# Patient Record
Sex: Male | Born: 1985 | Race: Black or African American | Hispanic: No | Marital: Married | Smoking: Current every day smoker
Health system: Southern US, Community
[De-identification: ages and names within clinical notes are randomized; demographics above are authoritative.]

---
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
## 2008-06-18 ENCOUNTER — Emergency Department: Payer: Self-pay | Admitting: Emergency Medicine

## 2010-03-19 ENCOUNTER — Emergency Department: Payer: Self-pay | Admitting: Emergency Medicine

## 2010-05-30 IMAGING — CT CT STONE STUDY
1 of 2 series · 15 of 32 positions shown, 19 images · non-contrast
Comparison: none

REASON FOR EXAM: abdominal pain, hematuria, flank pain
COMMENTS:   LMP: male

PROCEDURE:     CT  - CT ABDOMEN /PELVIS WO (STONE)  - March 19, 2010 [DATE]
RESULT:     CT abdomen and pelvis dated 03/19/2010.
TECHNIQUE: Helical 3 mm sections were obtained from the lung bases through
the pubic symphysis without administration of intravenous contrast.

[Series 2: stone · axial · 0.66mm/px · z∈[-669,-264]mm · 15 of 152 slices shown, 19 images]
[im 11/152  soft-tissue]
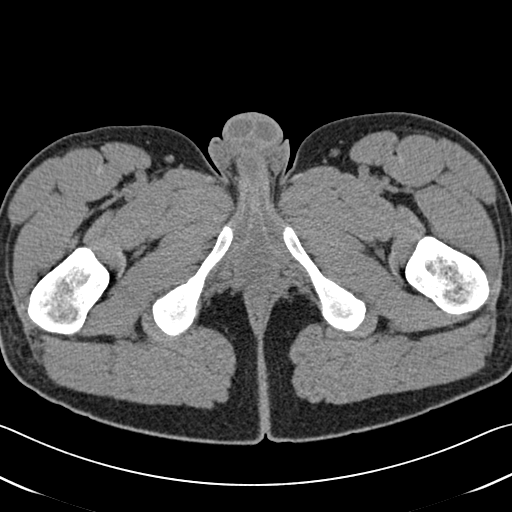
[im 11/152  bone]
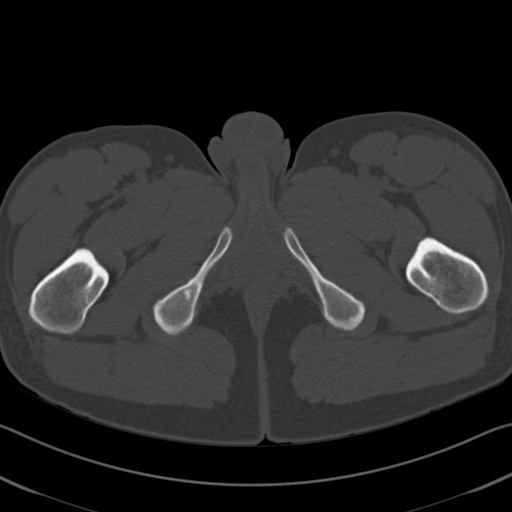
[im 22/152  soft-tissue]
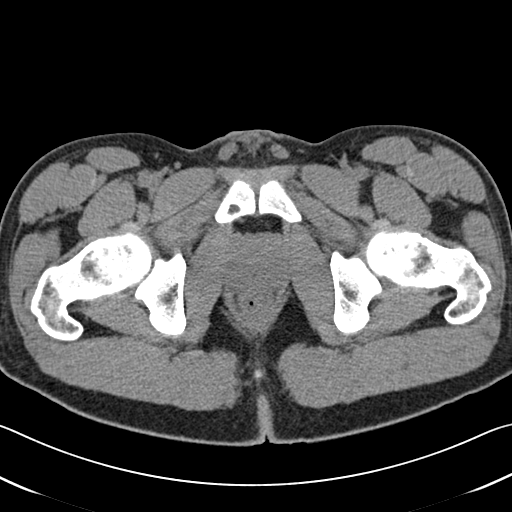
[im 33/152  soft-tissue]
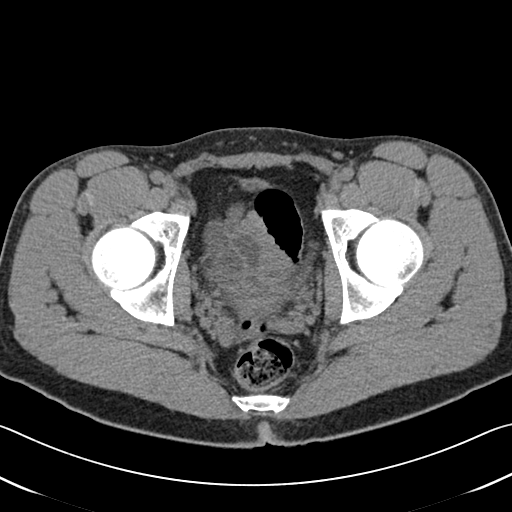
[im 44/152  soft-tissue]
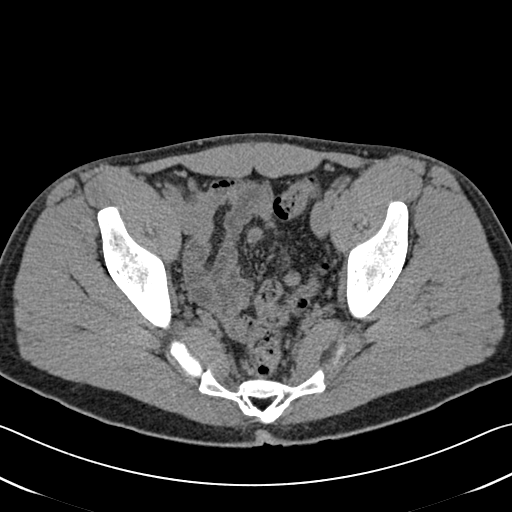
[im 54/152  soft-tissue]
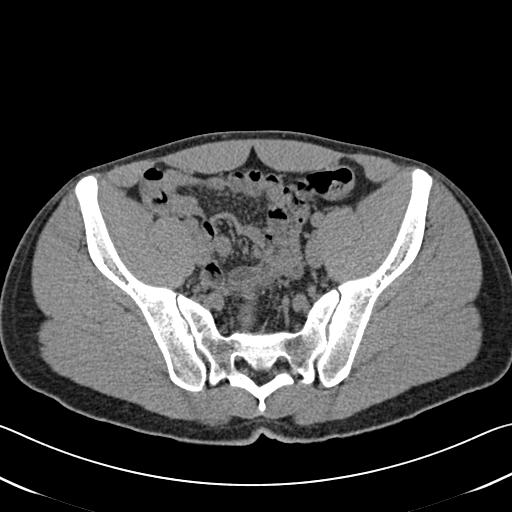
[im 65/152  soft-tissue]
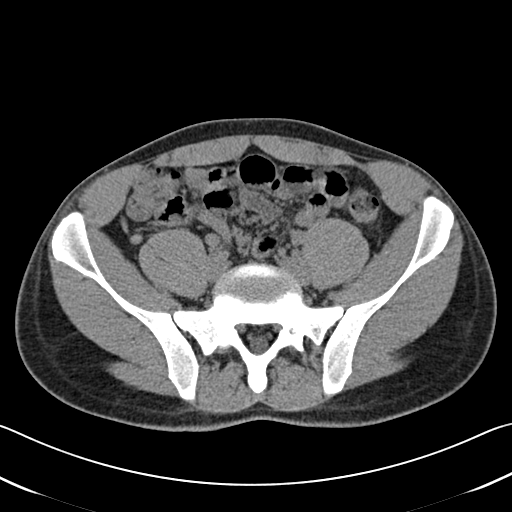
[im 76/152  soft-tissue]
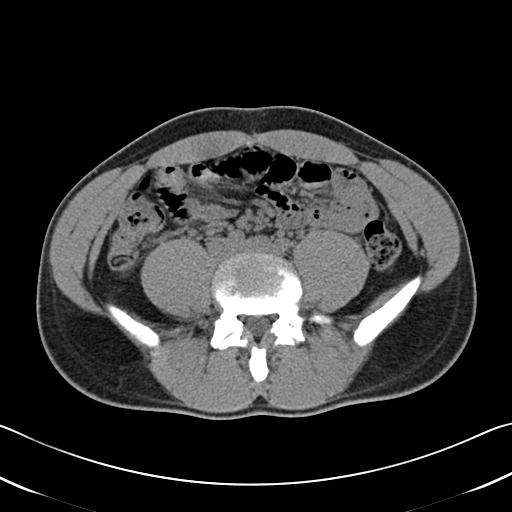
[im 87/152  soft-tissue]
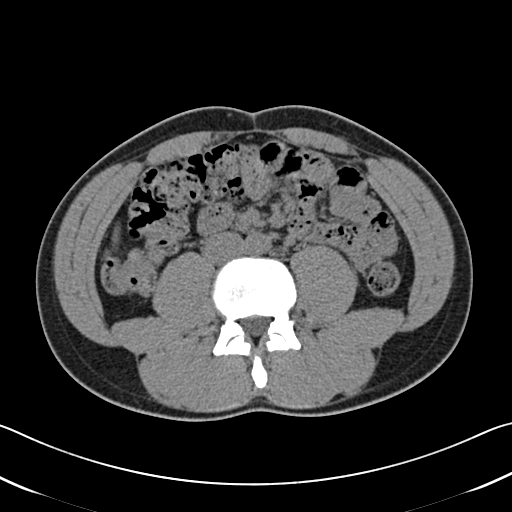
[im 98/152  soft-tissue]
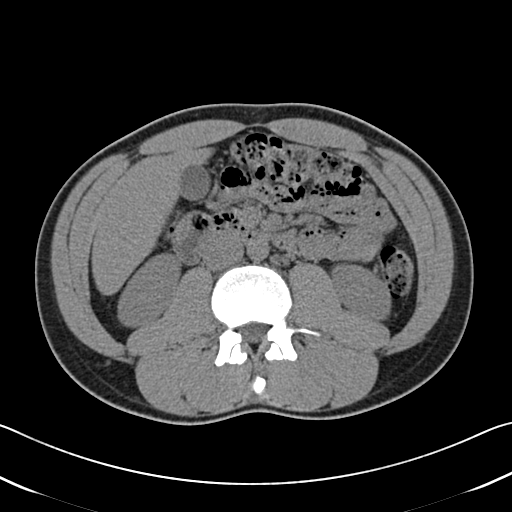
[im 98/152  bone]
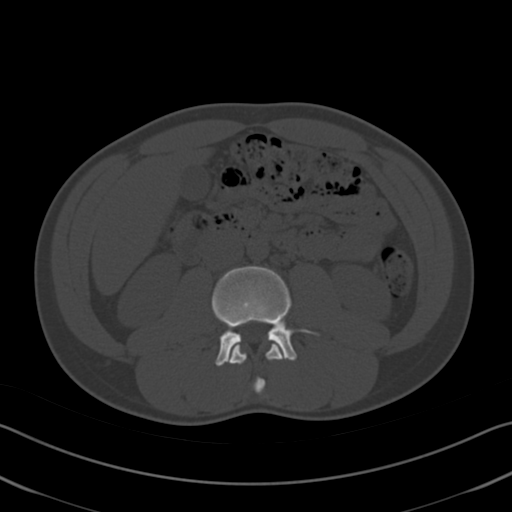
[im 108/152  soft-tissue]
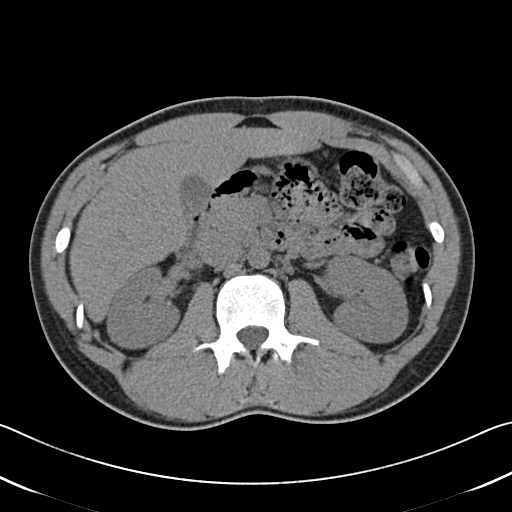
[im 119/152  soft-tissue]
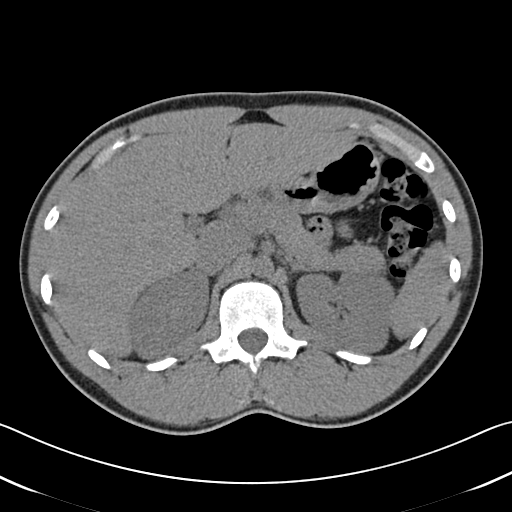
[im 130/152  soft-tissue]
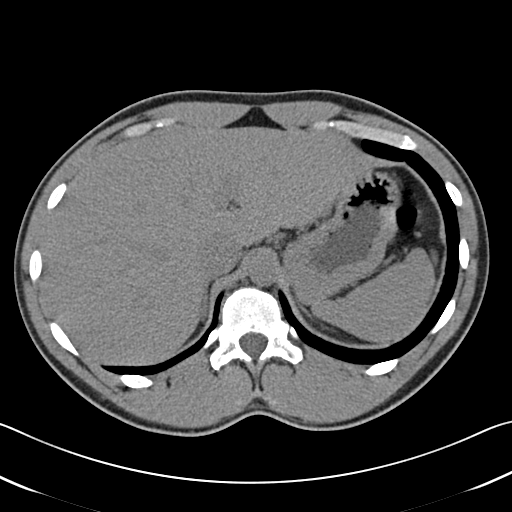
[im 130/152  lung]
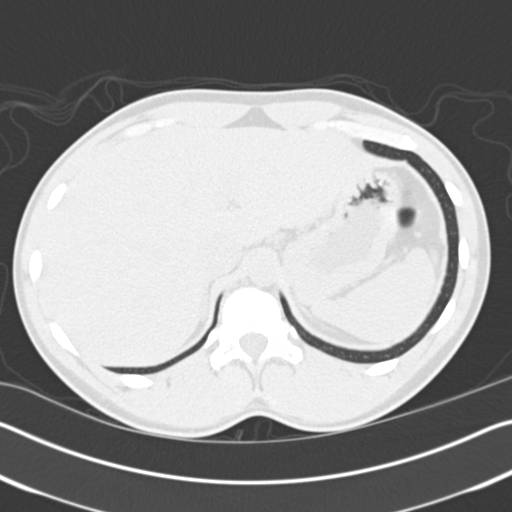
[im 135/152  lung]
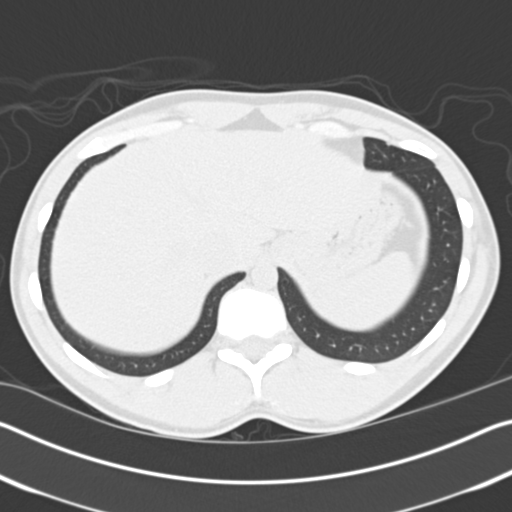
[im 141/152  soft-tissue]
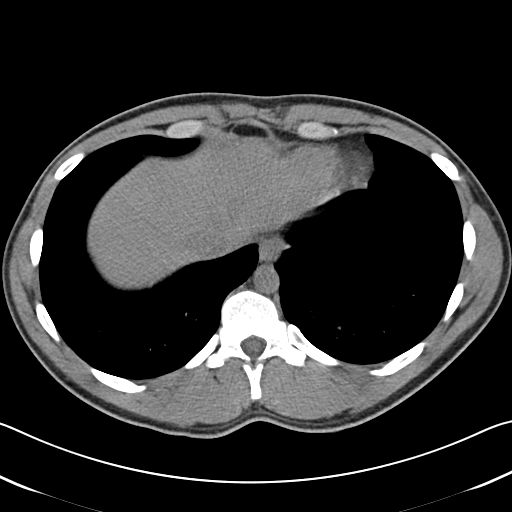
[im 141/152  lung]
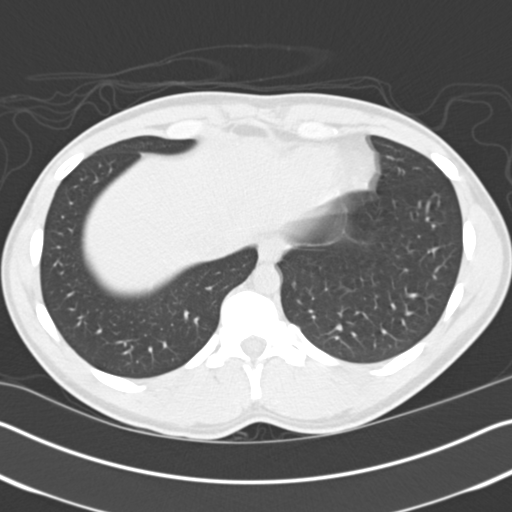
[im 146/152  lung]
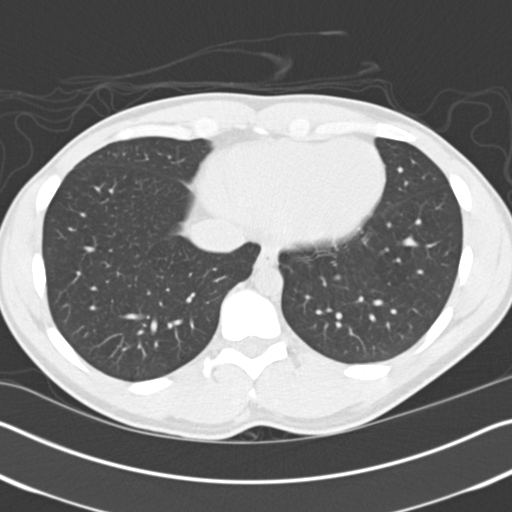

[15 of 32 positions shown; findings below may reference images not displayed]

FINDINGS: Evaluation of the lung bases is unremarkable.

Noncontrast evaluation of the liver, spleen, adrenals, pancreas, is
unremarkable. Evaluation of the kidneys demonstrates no evidence of
hydronephrosis, hydroureter, nor ureteral or nephrolithiasis. There is no CT
evidence of a bowel obstruction. Within the limitations of a noncontrasted
CT there is no evidence of enteritis, colitis, diverticulitis, nor
appendicitis. There is no CT evidence of abdominal aortic aneurysm.
IMPRESSION: No CT evidence of obstructive or inflammatory abnormalities
within the limitations of a noncontrasted CT. Specifically there is no CT
evidence of renal calculus disease.

## 2011-01-04 ENCOUNTER — Inpatient Hospital Stay: Payer: Self-pay | Admitting: Psychiatry

## 2012-03-29 ENCOUNTER — Emergency Department: Payer: Self-pay | Admitting: *Deleted

## 2012-11-30 ENCOUNTER — Emergency Department: Payer: Self-pay | Admitting: Emergency Medicine

## 2012-11-30 LAB — CBC
HGB: 15.3 g/dL (ref 13.0–18.0)
MCH: 33 pg (ref 26.0–34.0)
MCHC: 35.1 g/dL (ref 32.0–36.0)

## 2012-11-30 LAB — COMPREHENSIVE METABOLIC PANEL
Anion Gap: 8 (ref 7–16)
BUN: 13 mg/dL (ref 7–18)
Bilirubin,Total: 0.6 mg/dL (ref 0.2–1.0)
Calcium, Total: 9.5 mg/dL (ref 8.5–10.1)
Glucose: 78 mg/dL (ref 65–99)
Osmolality: 273 (ref 275–301)
SGOT(AST): 40 U/L — ABNORMAL HIGH (ref 15–37)

## 2012-12-01 LAB — URINALYSIS, COMPLETE
Blood: NEGATIVE
Glucose,UR: NEGATIVE mg/dL (ref 0–75)
Nitrite: NEGATIVE
Ph: 6 (ref 4.5–8.0)
Protein: NEGATIVE
RBC,UR: <1 /HPF (ref 0–5)
Specific Gravity: 1.027 (ref 1.003–1.030)
Squamous Epithelial: 2

## 2012-12-20 ENCOUNTER — Emergency Department: Payer: Self-pay | Admitting: Emergency Medicine

## 2012-12-20 LAB — COMPREHENSIVE METABOLIC PANEL
Albumin: 4 g/dL (ref 3.4–5.0)
Alkaline Phosphatase: 94 U/L (ref 50–136)
Anion Gap: 9 (ref 7–16)
BUN: 10 mg/dL (ref 7–18)
Bilirubin,Total: 0.3 mg/dL (ref 0.2–1.0)
Co2: 25 mmol/L (ref 21–32)
EGFR (Non-African Amer.): >60
Glucose: 84 mg/dL (ref 65–99)
Osmolality: 278 (ref 275–301)
SGOT(AST): 27 U/L (ref 15–37)
Sodium: 140 mmol/L (ref 136–145)

## 2012-12-20 LAB — CBC
HCT: 41.4 % (ref 40.0–52.0)
HGB: 14.1 g/dL (ref 13.0–18.0)
MCH: 32.4 pg (ref 26.0–34.0)
MCHC: 34.1 g/dL (ref 32.0–36.0)

## 2012-12-22 LAB — URINALYSIS, COMPLETE
Bacteria: NONE SEEN
Bilirubin,UR: NEGATIVE
Ketone: NEGATIVE
Ph: 5 (ref 4.5–8.0)
Protein: NEGATIVE
RBC,UR: <1 /HPF (ref 0–5)

## 2013-03-02 IMAGING — CT CT ABD-PELV W/ CM
1 of 2 series · 15 of 32 positions shown, 19 images · IV contrast (isovue)
Comparison: None

REASON FOR EXAM: (1) abd pain - lower - eval for appendicitis; (2) abd
pain - lower - eval for ap
COMMENTS:

PROCEDURE:     CT  - CT ABDOMEN / PELVIS  W  - December 20, 2012  [DATE]
RESULT:     History: Abdominal pain
TECHNIQUE: Multiple axial images of the abdomen and pelvis were performed
from the lung bases to the pubic symphysis, with p.o. contrast and with 100
ml of Isovue 300 intravenous contrast.

[Series 2: 3mm soft tissue · axial · 0.68mm/px · z∈[-426,-24]mm · 15 of 148 slices shown, 19 images]
[im 7/148  soft-tissue]
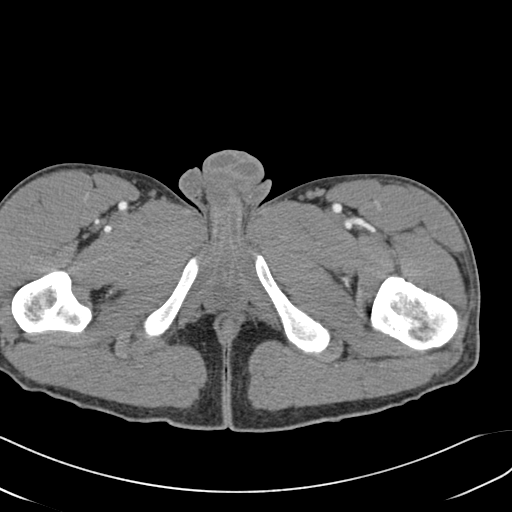
[im 7/148  bone]
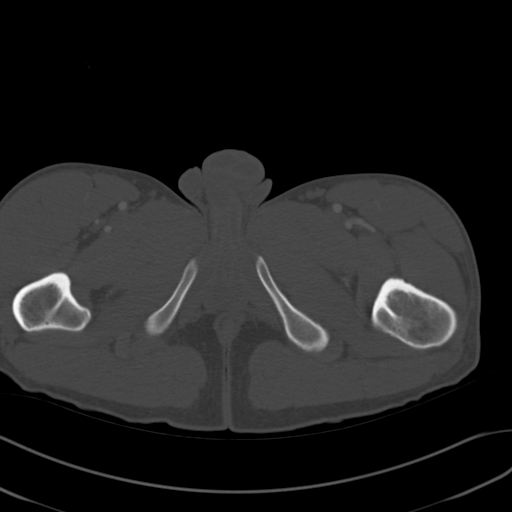
[im 19/148  soft-tissue]
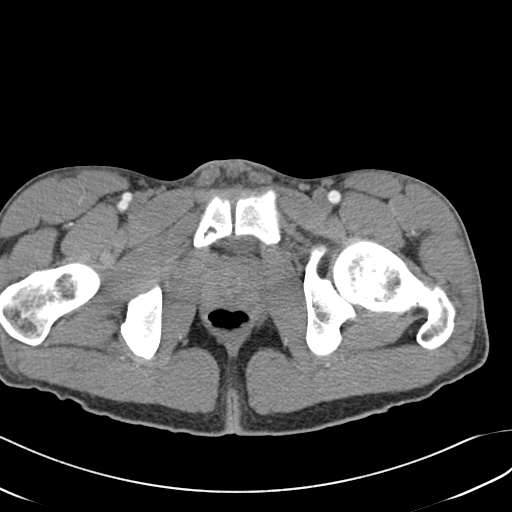
[im 31/148  soft-tissue]
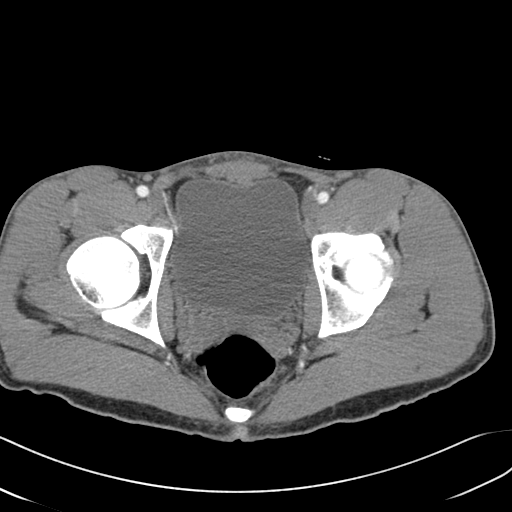
[im 43/148  soft-tissue]
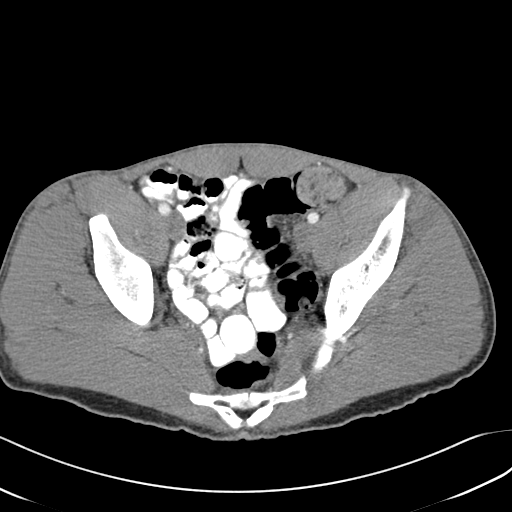
[im 50/148  soft-tissue]
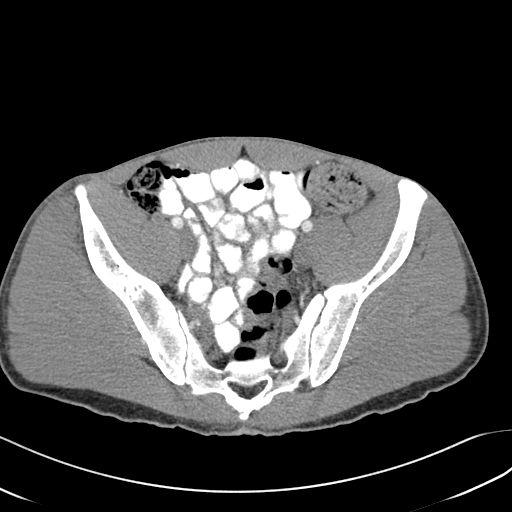
[im 62/148  soft-tissue]
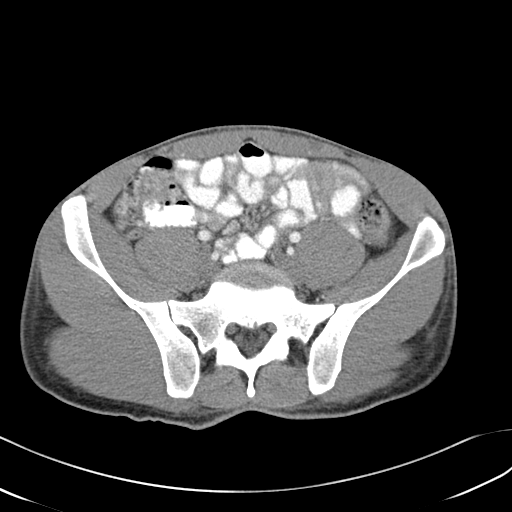
[im 74/148  soft-tissue]
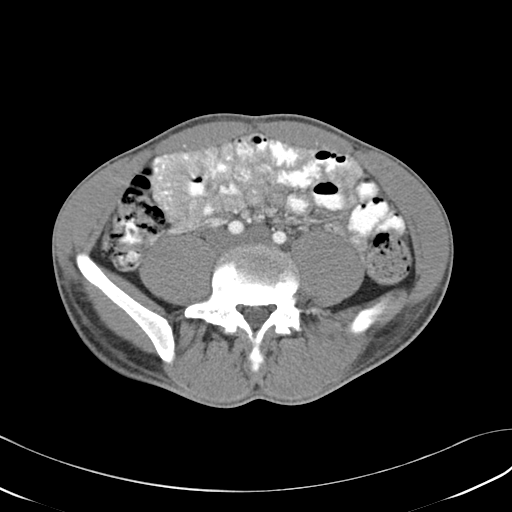
[im 86/148  soft-tissue]
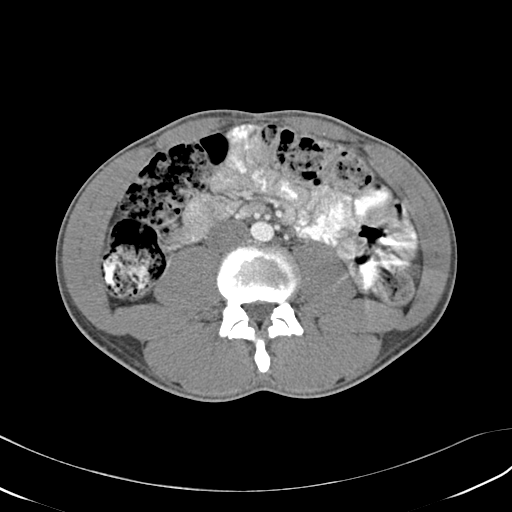
[im 99/148  soft-tissue]
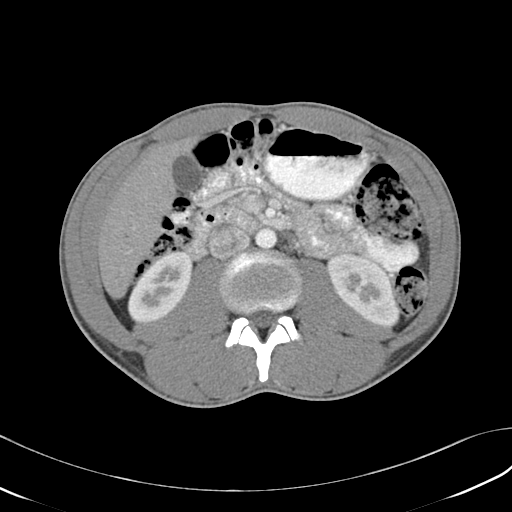
[im 99/148  bone]
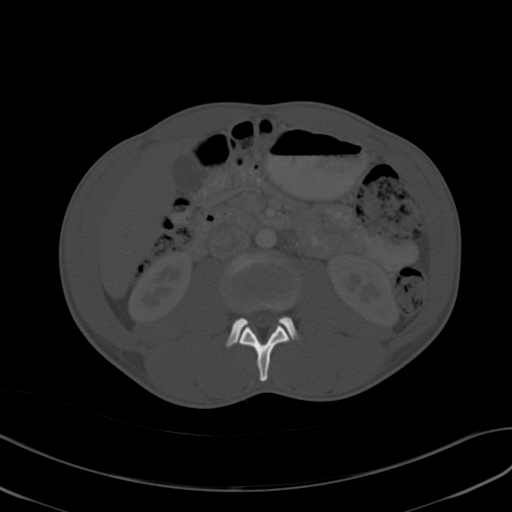
[im 105/148  soft-tissue]
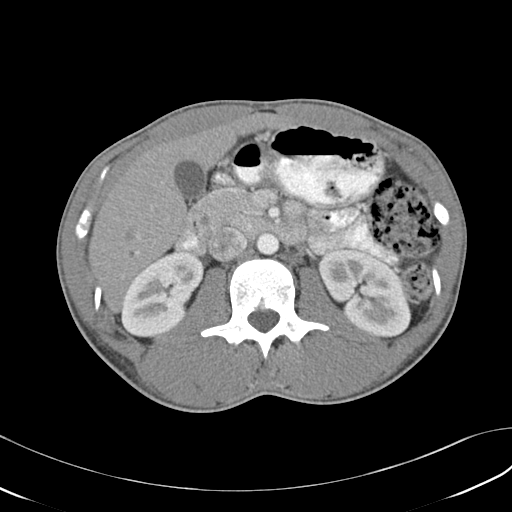
[im 117/148  soft-tissue]
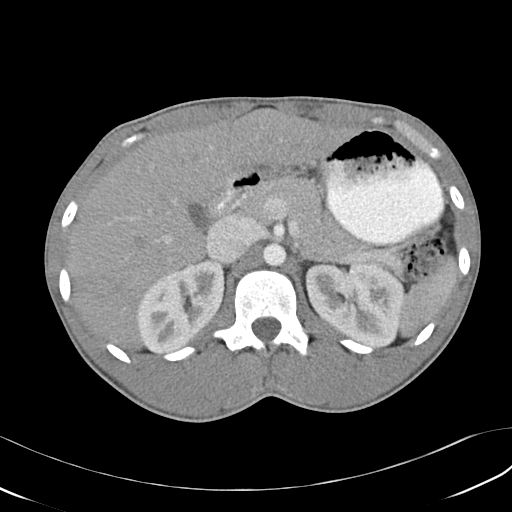
[im 123/148  lung]
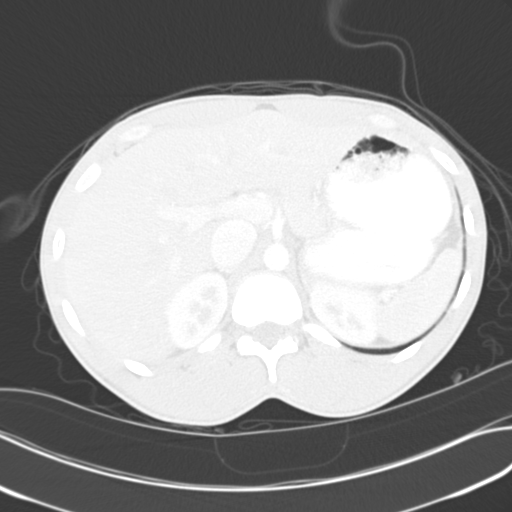
[im 129/148  soft-tissue]
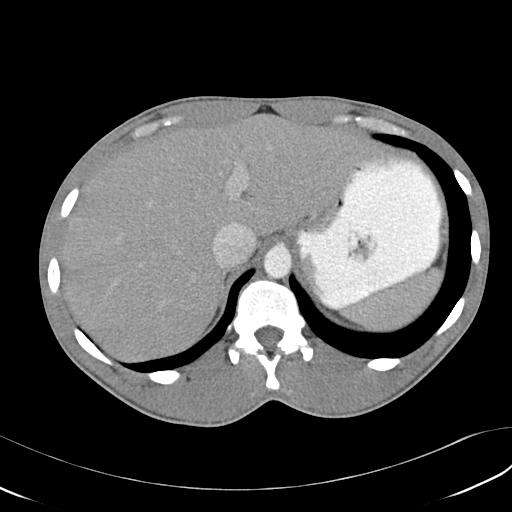
[im 129/148  lung]
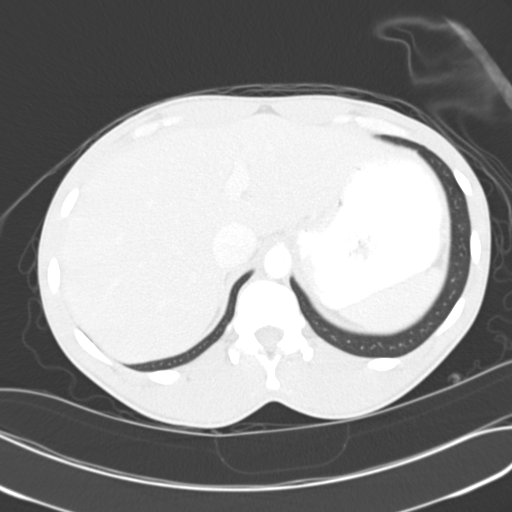
[im 135/148  lung]
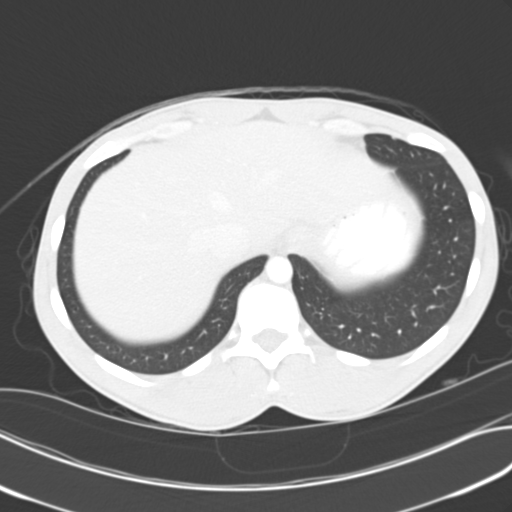
[im 141/148  soft-tissue]
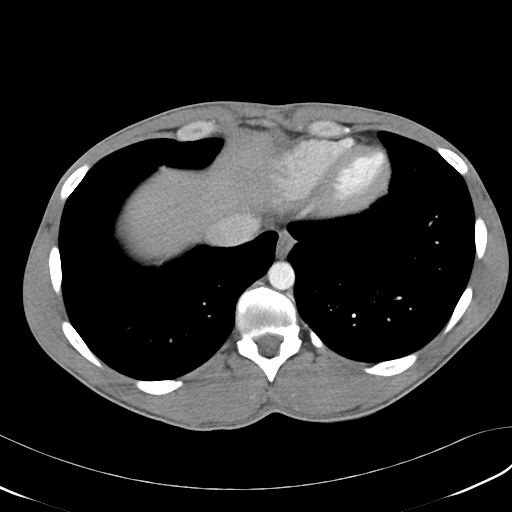
[im 141/148  lung]
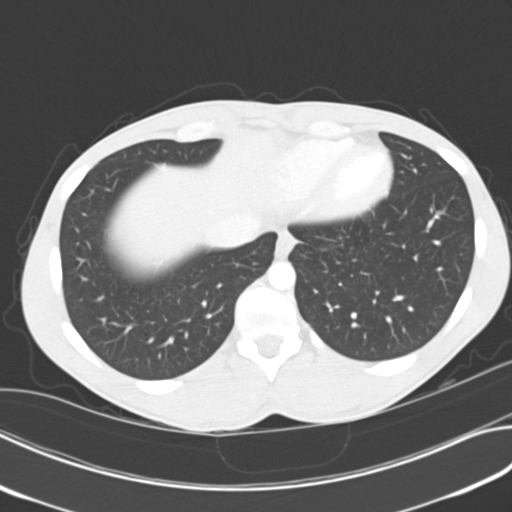

[15 of 32 positions shown; findings below may reference images not displayed]

FINDINGS: The lung bases are clear. There is no pneumothorax. The heart size is
normal.

The liver demonstrates no focal abnormality. There is no intrahepatic or
extrahepatic biliary ductal dilatation. The gallbladder is unremarkable. The
spleen demonstrates no focal abnormality. The kidneys, adrenal glands, and
pancreas are normal. The bladder is unremarkable.

The stomach, duodenum, small intestine, and large intestine demonstrate no
contrast extravasation or dilatation. There is a normal caliber appendix in
the right lower quadrant without periappendiceal inflammatory changes. There
is no pneumoperitoneum, pneumatosis, or portal venous gas. There is no
abdominal or pelvic free fluid. There is no lymphadenopathy.

The abdominal aorta is normal in caliber .

There is a lucent lesion in the right inferior pubic ramus unchanged the
prior exam likely representing a benign fibro-osseous lesion.
IMPRESSION: 1. Normal appendix.

[REDACTED]

## 2013-04-30 ENCOUNTER — Emergency Department: Payer: Self-pay | Admitting: Internal Medicine

## 2013-04-30 LAB — CBC
HCT: 44.5 % (ref 40.0–52.0)
HGB: 15.6 g/dL (ref 13.0–18.0)
MCH: 32.3 pg (ref 26.0–34.0)
RBC: 4.84 10*6/uL (ref 4.40–5.90)
RDW: 13 % (ref 11.5–14.5)

## 2013-04-30 LAB — URINALYSIS, COMPLETE
Bacteria: NONE SEEN
Bilirubin,UR: NEGATIVE
Glucose,UR: NEGATIVE mg/dL (ref 0–75)
Leukocyte Esterase: NEGATIVE
RBC,UR: <1 /HPF (ref 0–5)
Specific Gravity: 1.027 (ref 1.003–1.030)
WBC UR: <1 /HPF (ref 0–5)

## 2013-04-30 LAB — COMPREHENSIVE METABOLIC PANEL
Alkaline Phosphatase: 100 U/L (ref 50–136)
Anion Gap: 7 (ref 7–16)
BUN: 15 mg/dL (ref 7–18)
Bilirubin,Total: 0.4 mg/dL (ref 0.2–1.0)
Chloride: 107 mmol/L (ref 98–107)
Creatinine: 1.13 mg/dL (ref 0.60–1.30)
EGFR (Non-African Amer.): >60
Osmolality: 279 (ref 275–301)
Potassium: 3.5 mmol/L (ref 3.5–5.1)
SGPT (ALT): 23 U/L (ref 12–78)
Total Protein: 8.5 g/dL — ABNORMAL HIGH (ref 6.4–8.2)

## 2013-04-30 LAB — DRUG SCREEN, URINE
Barbiturates, Ur Screen: NEGATIVE (ref ?–200)
Benzodiazepine, Ur Scrn: NEGATIVE (ref ?–200)
Cannabinoid 50 Ng, Ur ~~LOC~~: NEGATIVE (ref ?–50)
Cocaine Metabolite,Ur ~~LOC~~: NEGATIVE (ref ?–300)
Methadone, Ur Screen: NEGATIVE (ref ?–300)
Tricyclic, Ur Screen: NEGATIVE (ref ?–1000)

## 2013-04-30 LAB — ETHANOL
Ethanol %: <0.003 % (ref 0.000–0.080)
Ethanol: <3 mg/dL

## 2013-04-30 LAB — TSH: Thyroid Stimulating Horm: 0.69 u[IU]/mL

## 2013-08-06 ENCOUNTER — Emergency Department (HOSPITAL_COMMUNITY)
Admission: EM | Admit: 2013-08-06 | Discharge: 2013-08-06 | Disposition: A | Discharge status: Home / Self Care | Payer: Self-pay | Attending: Emergency Medicine | Admitting: Emergency Medicine

## 2013-08-06 ENCOUNTER — Encounter (HOSPITAL_COMMUNITY): Payer: Self-pay | Admitting: *Deleted

## 2013-08-06 DIAGNOSIS — F17200 Nicotine dependence, unspecified, uncomplicated: Secondary | ICD-10-CM | POA: Insufficient documentation

## 2013-08-06 DIAGNOSIS — K05219 Aggressive periodontitis, localized, unspecified severity: Secondary | ICD-10-CM | POA: Insufficient documentation

## 2013-08-06 MED ORDER — HYDROCODONE-ACETAMINOPHEN 5-325 MG PO TABS: 1.0000 | ORAL_TABLET | Freq: Four times a day (QID) | ORAL | Status: DC | PRN

## 2013-08-06 MED ORDER — HYDROCODONE-ACETAMINOPHEN 5-325 MG PO TABS
2.0000 | ORAL_TABLET | Freq: Once | ORAL | Status: AC
  Administered 2013-08-06: 2 via ORAL
  Filled 2013-08-06: qty 2

## 2013-08-06 MED ORDER — IBUPROFEN 400 MG PO TABS
600.0000 mg | ORAL_TABLET | Freq: Once | ORAL | Status: AC
  Administered 2013-08-06: 10:00:00 600 mg via ORAL
  Filled 2013-08-06 (×2): qty 1

## 2013-08-06 MED ORDER — IBUPROFEN 600 MG PO TABS: 600.0000 mg | ORAL_TABLET | Freq: Four times a day (QID) | ORAL | Status: DC | PRN

## 2013-08-06 MED ORDER — METRONIDAZOLE 500 MG PO TABS: 500.0000 mg | ORAL_TABLET | Freq: Two times a day (BID) | ORAL | Status: DC

## 2013-08-06 MED ORDER — PENICILLIN V POTASSIUM 500 MG PO TABS: 500.0000 mg | ORAL_TABLET | Freq: Four times a day (QID) | ORAL | Status: AC

## 2013-08-06 NOTE — ED Provider Notes (Signed)
CSN: [id number]     Arrival date & time 08/06/13  7846 History   First MD Initiated Contact with Patient 08/06/13 [phone]     Chief Complaint  Patient presents with  . Dental Pain   (Consider location/radiation/quality/duration/timing/severity/associated sxs/prior Treatment) HPI Comments: Pt comes in with cc of toothache. Pt has had a toothache off and on for several days now, more constant x 2 days. The pain is in the Left lower quadrant, and is throbbing, worse with po intake and with cold temps. No n/v/f/c/trismus. + swelling to the left jaw. Pt is noted to have a low grade temp in the ED.  Patient is a 27 y.o. male presenting with tooth pain. The history is provided by the patient.  Dental Pain Associated symptoms: no drooling and no neck pain     History reviewed. No pertinent past medical history. History reviewed. No pertinent past surgical history. History reviewed. No pertinent family history. History  Substance Use Topics  . Smoking status: Current Every Day Smoker  . Smokeless tobacco: Not on file  . Alcohol Use: Yes    Review of Systems  Constitutional: Negative for activity change and appetite change.  HENT: Positive for dental problem. Negative for sore throat, drooling, neck pain and neck stiffness.   Respiratory: Negative for cough and shortness of breath.   Cardiovascular: Negative for chest pain.  Gastrointestinal: Negative for abdominal pain.  Genitourinary: Negative for dysuria.    Allergies  Review of patient's allergies indicates no known allergies.  Home Medications   Current Outpatient Rx  Name  Route  Sig  Dispense  Refill  . Acetaminophen (TYLENOL PO)   Oral   Take 2 tablets by mouth every 6 (six) hours as needed (pain).          BP 143/81  Pulse 80  Temp(Src) 100.3 F (37.9 C) (Oral)  Resp 20  SpO2 99% Physical Exam  Nursing note and vitals reviewed. Constitutional: He is oriented to person, place, and time. He appears well-developed.   HENT:  Head: Normocephalic and atraumatic.  Pt has dental erosion - tooth 20. There is no gingival fluctuance, no bleeding or drainage. Pt has tenderness to palpation. No cervical lymphadenopathy, no trismus or drooling.  Eyes: Conjunctivae and EOM are normal. Pupils are equal, round, and reactive to light.  Neck: Normal range of motion. Neck supple.  Cardiovascular: Normal rate and regular rhythm.   Pulmonary/Chest: Effort normal and breath sounds normal.  Abdominal: Soft. Bowel sounds are normal. He exhibits no distension. There is no tenderness. There is no rebound and no guarding.  Lymphadenopathy:    He has no cervical adenopathy.  Neurological: He is alert and oriented to person, place, and time.  Skin: Skin is warm.    ED Course  Procedures (including critical care time) Labs Review Labs Reviewed - No data to display Imaging Review No results found.  MDM  No diagnosis found.  DDx includes: - Periapical tooth infection - Dental abscess - Gingivitis - Dental trauma - Pulpitis - Nerve root compression  Pt comes in with cc of toothache, noted to have a fever. No systemic signs concerning for deep infection - no drooling, trismus, lymphadenopathy. Does have some left mandibular swelling - likely reactive, doubt abscess. Pt is not immunocompromised.  Pt has no insurance. Will d.c with Pen VK and also flagyl - to get even better anaerobic coverage incase this is an evolving infection. Return precautions discussed. Pt understands that he needs to see a  dentist immediately.    Derwood Kaplan, MD 08/06/13 [phone]

## 2013-08-06 NOTE — ED Notes (Signed)
Patient presents to ed c/o left lower toothache onset 3 days ago. States this am lower jaw is swollen

## 2014-06-02 ENCOUNTER — Emergency Department (HOSPITAL_COMMUNITY)
Admission: EM | Admit: 2014-06-02 | Discharge: 2014-06-02 | Disposition: A | Discharge status: Home / Self Care | Payer: Self-pay | Attending: Emergency Medicine | Admitting: Emergency Medicine

## 2014-06-02 ENCOUNTER — Encounter (HOSPITAL_COMMUNITY): Payer: Self-pay | Admitting: Emergency Medicine

## 2014-06-02 DIAGNOSIS — F17200 Nicotine dependence, unspecified, uncomplicated: Secondary | ICD-10-CM | POA: Insufficient documentation

## 2014-06-02 DIAGNOSIS — K029 Dental caries, unspecified: Secondary | ICD-10-CM | POA: Insufficient documentation

## 2014-06-02 DIAGNOSIS — G8929 Other chronic pain: Secondary | ICD-10-CM | POA: Insufficient documentation

## 2014-06-02 DIAGNOSIS — R6884 Jaw pain: Secondary | ICD-10-CM | POA: Insufficient documentation

## 2014-06-02 DIAGNOSIS — Z792 Long term (current) use of antibiotics: Secondary | ICD-10-CM | POA: Insufficient documentation
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
## 2014-06-02 NOTE — Discharge Instructions (Signed)
1. Medications: usual home medications 2. Treatment: rest, drink plenty of fluids,  3. Follow Up: Please followup with Dr. Erik Obey for discussion of your diagnoses and further evaluation after today's visit; if you do not have a primary care doctor use the resource guide provided to find one;     Emergency Department Resource Guide 1) Find a Doctor and Pay Out of Pocket Although you won't have to find out who is covered by your insurance plan, it is a good idea to ask around and get recommendations. You will then need to call the office and see if the doctor you have chosen will accept you as a new patient and what types of options they offer for patients who are self-pay. Some doctors offer discounts or will set up payment plans for their patients who do not have insurance, but you will need to ask so you aren't surprised when you get to your appointment.  2) Contact Your Local Health Department Not all health departments have doctors that can see patients for sick visits, but many do, so it is worth a call to see if yours does. If you don't know where your local health department is, you can check in your phone book. The CDC also has a tool to help you locate your state's health department, and many state websites also have listings of all of their local health departments.  3) Find a [facility] If your illness is not likely to be very severe or complicated, you may want to try a walk in clinic. These are popping up all over the country in pharmacies, drugstores, and shopping centers. They're usually staffed by nurse practitioners or physician assistants that have been trained to treat common illnesses and complaints. They're usually fairly quick and inexpensive. However, if you have serious medical issues or chronic medical problems, these are probably not your best option.  No Primary Care Doctor: - Call Health Connect at  [phone] - they can help you locate a primary care doctor that   accepts your insurance, provides certain services, etc. - Physician Referral Service- [phone]  Chronic Pain Problems: Organization         Address  Phone   Notes  [facility]  [phone] Patients need to be referred by their primary care doctor.   Medication Assistance: Organization         Address  Phone   Notes  [facility] Medication [facility] [address] [place], [place] [address] [phone] --Must be a resident of [facility] -- Must have NO insurance coverage whatsoever (no Medicaid/ Medicare, etc.) -- The pt. MUST have a primary care doctor that directs their care regularly and follows them in the community   [facility]  [phone]   [employer]  [phone]    Agencies that provide inexpensive medical care: Organization         Address  Phone   Notes  [place]  [phone]   [facility]    [phone]   [facility] [place], [place] [address] [phone]   [facility] [address], [place] [phone]   [facility]anned Parenthood    [phone]   [facility]    [phone]   [address], [place] Phone:  [phone], Fax:  [phone] Hours of Operation:  9 am - 6 pm, M-F.  Also accepts Medicaid/Medicare and self-pay.  °[facility] ° [address], [place] Phone: ([phone]) [phone], Fax: [phone]. Hours of Operation:  8:30 am - 5:30 pm, M-F.  Also accepts Medicaid and self-pay.  °[facility] [place] [address], [place] Phone: (336) [phone]   °[facility] [address], [place], [place] (336)723-1848, Ext. 123 Mondays & Thursdays: 7-9 AM.  First 15 patients are seen on a first come, first serve basis. °  ° °Medicaid-accepting [place] Providers: ° °Organization          Address  Phone   Notes  °[facility] [address], [place] ([phone]) [phone] Also accepts self-pay patients.  °[facility] [address], [place] ° (336) [phone]   °[facility] [address], [place] (336) 288-8857   °[facility] [address] [place] [address], [place] (336) 299-7000   °Veita Bland [address], [place]  ° (336) [phone] Only accepts [place] Access Medicaid patients after they have their name applied to their card.  ° °Self-Pay (no insurance) in [place]: ° °Organization         Address  Phone   Notes  °Sickle Cell Patients, [facility] [address], [place] (336) 832-1970   °[facility] [address], [place] (336) 832-4400   °[facility] ° [address], [place] (336) [phone]   °[facility]/Dr. Osei-Bonsu ° [address] [place] [address], [place] or [address], [place] (336) [phone] Phone number for both [place] and [place] locations is the same.  °[facility] [address], [place] (336) [phone]   °[facility] [address] [place] [address], [place] or [address] (336) 852-7530 °(336) 878-2260   °[facility] [address], [place] (336) 350-1642, phone; (336) [phone], fax Sees patients 1st and 3rd Saturday of every month.  Must not qualify for public or private insurance (i.e. Medicaid, Medicare, [place] Health Choice, Veterans' Benefits) • Household income should be no more than 200% of the poverty level •The clinic cannot treat you if you are pregnant or think you are pregnant • Sexually transmitted diseases are not treated at the clinic.  ° ° °Dental Care: °Organization         Address  Phone  Notes  °[facility] [facility] [address],  [place] (336) 641-6152 Accepts children up to age 21 who are enrolled in Medicaid or [place] Health Choice; pregnant women with a Medicaid card; and children who have applied for Medicaid or [place] Health Choice, but were declined, whose parents can pay a reduced fee at time of service.  °[place] [place]  [address], [place] (336) 641-7733 Accepts children up to age 21 who are enrolled in Medicaid or [place] Health Choice; pregnant women with a Medicaid card; and children who have applied for Medicaid or [place] Health Choice, but were declined, whose parents can pay a reduced fee at time of service.  °[facility] ° [address],  (336) 641-4533 Patients are seen by appointment only. Walk-ins are not accepted. [facility] will see patients 18 years   of age and older. °Monday - Tuesday (8am-5pm) °Most Wednesdays (8:30-5pm) °$30 per visit, cash only  °[facility] ° [address], [place] (336) [phone] Patients are seen by appointment only. Walk-ins are not accepted. [facility] will see patients 18 years of age and older. °One Wednesday Evening (Monthly: Volunteer Based).  $30 per visit, cash only  °[facility]  ([phone]) [phone] for adults; Children under age 4, call Graduate Pediatric Dentistry at ([phone]) [phone]. Children aged 4-14, please call ([phone]) [phone] to request a pediatric application. ° Dental services are provided in all areas of dental care including fillings, crowns and bridges, complete and partial dentures, implants, gum treatment, root canals, and extractions. Preventive care is also provided. Treatment is provided to both adults and children. °Patients are selected via a lottery and there is often a waiting list. °  °[facility] [address], °[place] ° (336) 763-8833 [email or web address] °  °[facility] [address], [place], [place]  (336)723-1848, Ext. 123 Second and Fourth Thursday of each month, opens at 6:30 AM; Clinic ends at 9 AM.  Patients are seen on a first-come first-served basis, and a limited number are seen during each clinic.  ° °[facility] ° [address], [place], [place] (336) [phone]   Eligibility Requirements °You must have lived in [place], [place], or [place] counties for at least the last three months. °  You cannot be eligible for state or federal sponsored healthcare insurance, including Veterans Administration, Medicaid, or Medicare. °  You generally cannot be eligible for healthcare insurance through your employer.  °  How to apply: °Eligibility screenings are held every Tuesday and Wednesday afternoon from 1:00 pm until 4:00 pm. You do not need an appointment for the interview!  °[facility] [address], [place], [place] 336-631-2330   °[place] [facility]  336-342-8273   °[facility]  336-703-3100   °[facility]  336-570-6415   ° °Behavioral Health Resources in the Community: °Intensive Outpatient Programs °Organization         Address  Phone  Notes  °[facility] [address], [place], [place] 336-878-6098   °[facility] [address], [place], [place] 336-832-9800   °[facility] [address], [place], [place] ° 336-882-2125   °[facility] [address],  °[place], [place] 1-800-853-5163 or 336-641-4981   °Substance Abuse Resources °Organization         Address  Phone  Notes  °[facility]  336-882-2125   °[facility]  336-784-9470   °[facility]  336-285-9073   °[facility]  336-845-3988   °Residential & Outpatient Substance Abuse Program  1-800-659-3381   °Psychological Services °Organization         Address  Phone  Notes  °[facility]  336- 832-9600   °[facility]  336- 378-7881    °[facility] [address], [place] 1-800-853-5163 or 336-641-4981   ° °Mobile Crisis Teams °Organization         Address  Phone  Notes  °[facility], Mobile Crisis Care Unit  1-877-626-1772   °Assertive °[facility] ° [address] [place], [place] 336-834-9664   °Sharon DeEsch [address] °[place]  336-554-5454   ° °Self-Help/Support Groups °Organization           Address  Phone             Notes  °[facility] [place] - variety of support groups  336- 373-1402 Call for more information  °Narcotics Anonymous (NA), [facility] [address], °[place]  2 meetings at this location  ° °Residential Treatment Programs °Organization         Address  Phone  Notes  °[facility] [address],    °[place]  1-866-801-8205   °[facility] ° [address], [place], [place] 704-293-8524   °[facility] [address], [place] 336-845-3988 Admissions: 8am-3pm M-F  °[facility] [address]    °[place], [place] 336-841-1104   °[facility] [address], [place], [place] 336-379-7146   °[facility] [address]  °[place], [place] 336-285-9073   °[facility] Intensive Outpatient [address], [place], [place] 336-852-3033   °[facility] [address]  °[place], [place] 1-877-615-2722 or 336-784-9470   °[facility] [address] [place], [place] 336-227-7417 Accepts Medicaid  °[facility] [address]  °[place] 1-800-659-3381 Substance Abuse/Addiction Treatment  ° °[place] Behavioral Health Resources °Organization         Address  Phone  Notes  °[facility]  (888) 581-9988   °Julie Brannon, PhD [address] [place], [place]   (336) 349-5553 or (336) 951-0000   °[facility]   [address] °[place], [place] (336) 349-4454   °[facility] [address], [place], [place] (336) 342-8316 Insurance/Medicaid/sponsorship through [facility]  °[facility] [address]                                    [place], [place] (336) 342-8316 Therapy/tele-psych/case  °[facility] [address]  ° [place], [place] (336) 349-2233    °Dr. Arfeen  (336) 349-4544   °[facility] [place]  [employer] [place] [facility] 1) [address], [place] °2) [address], [place] °3)  [address], [place] (336) 349-3220 °(336) 342-7768 ° °(336) 342-8140   °[place] Child Abuse [facility] (336) 342-1394 or (336) 342-3537 (After Hours)    ° ° ° ° °

## 2014-06-03 NOTE — ED Provider Notes (Signed)
Medical screening examination/treatment/procedure(s) were performed by non-physician practitioner and as supervising physician I was immediately available for consultation/collaboration.   EKG Interpretation None       Kalman Drape, MD 06/03/14 [phone]

## 2014-07-30 ENCOUNTER — Emergency Department (HOSPITAL_COMMUNITY)
Admission: EM | Admit: 2014-07-30 | Discharge: 2014-07-30 | Disposition: A | Discharge status: Home / Self Care | Payer: Self-pay | Attending: Emergency Medicine | Admitting: Emergency Medicine

## 2014-07-30 ENCOUNTER — Encounter (HOSPITAL_COMMUNITY): Payer: Self-pay | Admitting: Emergency Medicine

## 2014-07-30 DIAGNOSIS — F17200 Nicotine dependence, unspecified, uncomplicated: Secondary | ICD-10-CM | POA: Insufficient documentation

## 2014-07-30 DIAGNOSIS — G479 Sleep disorder, unspecified: Secondary | ICD-10-CM | POA: Insufficient documentation

## 2014-07-30 NOTE — ED Provider Notes (Signed)
Medical screening examination/treatment/procedure(s) were performed by non-physician practitioner and as supervising physician I was immediately available for consultation/collaboration.   EKG Interpretation None       Richarda Blade, MD 07/30/14 2151
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
## 2014-07-30 NOTE — ED Notes (Signed)
Pt reports that he has been moaning in his sleep at night and his girlfriend feels like he is having sexual dreams about someone else. Pt states that as a result that he does not want to go to sleep at night. Denies any problem falling asleep. Reports that he does not want to sleep because of the dreams he is having. No other complaints at this time.
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
## 2014-07-30 NOTE — Discharge Instructions (Signed)
°Emergency Department Resource Guide °1) Find a Doctor and Pay Out of Pocket °Although you won't have to find out who is covered by your insurance plan, it is a good idea to ask around and get recommendations. You will then need to call the office and see if the doctor you have chosen will accept you as a new patient and what types of options they offer for patients who are self-pay. Some doctors offer discounts or will set up payment plans for their patients who do not have insurance, but you will need to ask so you aren't surprised when you get to your appointment. ° °2) Contact Your Local Health Department °Not all health departments have doctors that can see patients for sick visits, but many do, so it is worth a call to see if yours does. If you don't know where your local health department is, you can check in your phone book. The CDC also has a tool to help you locate your state's health department, and many state websites also have listings of all of their local health departments. ° °3) Find a Walk-in Clinic °If your illness is not likely to be very severe or complicated, you may want to try a walk in clinic. These are popping up all over the country in pharmacies, drugstores, and shopping centers. They're usually staffed by nurse practitioners or physician assistants that have been trained to treat common illnesses and complaints. They're usually fairly quick and inexpensive. However, if you have serious medical issues or chronic medical problems, these are probably not your best option. ° °No Primary Care Doctor: °- Call [facility] at  832-8000 - they can help you locate a primary care doctor that  accepts your insurance, provides certain services, etc. °- Physician Referral Service- 1-800-533-3463 ° °Chronic Pain Problems: °Organization         Address  Phone   Notes  °[facility]  (336) 297-2271 Patients need to be referred by their primary care doctor.  ° °Medication  Assistance: °Organization         Address  Phone   Notes  °[place] [facility] [address] °[place], [place] [address] (336) 641-8030 --Must be a resident of [place] °-- Must have NO insurance coverage whatsoever (no Medicaid/ Medicare, etc.) °-- The pt. MUST have a primary care doctor that directs their care regularly and follows them in the community °  °[facility]  (866) 331-1348   °[employer]  (888) 892-1162   ° °Agencies that provide inexpensive medical care: °Organization         Address  Phone   Notes  °[facility]  (336) 832-8035   ° Internal Medicine    (336) 832-7272   °[facility] [address] °[place], [place] [address] (336) 832-4777   °[facility] [address], °[place] (336) 271-4999   °[facility]    (336) 373-0678   °[facility]    (336) 272-1050   °[facility] ° [address], [place] Phone:  (336) 832-4444, Fax:  (336) 832-4440 Hours of Operation:  9 am - 6 pm, M-F.  Also accepts Medicaid/Medicare and self-pay.  °[facility] ° [address],  Phone: (336) 832-3150, Fax: (336) 832-3151. Hours of Operation:  8:30 am - 5:30 pm, M-F.  Also accepts Medicaid and self-pay.  °[facility] [place] [address], [place] Phone: (336) 878-6027   °[facility] [address], [place], [place] (336)723-1848, Ext. 123 Mondays & Thursdays: 7-9 AM.  First 15 patients are seen on a first come, first serve basis. °  ° °Medicaid-accepting [place] Providers: ° °Organization         Address  Phone   Notes  °[facility] [address], [place] (336) 641-2100 Also accepts self-pay patients.  °[facility] [address], [place] ° (336) 856-9996   °[facility] [address], [place]  (336) 288-8857   °[facility] [address] [place] [address], [place] (336) 299-7000   °Veita Bland [address], [place]  ° (336) 373-1557 Only accepts [place] Access Medicaid patients after they have their name applied to their card.  ° °Self-Pay (no insurance) in [place]: ° °Organization         Address  Phone   Notes  °Sickle Cell Patients, [facility] [address], [place] (336) 832-1970   °[facility] [address], [place] (336) 832-4400   °[facility] [place] ° [address], [place] (336) 992-4800   °[facility]/Dr. Osei-Bonsu ° [address] [place] [address], [place] or [address], [place] (336) 841-8500 Phone number for both [place] and [place] locations is the same.  °[facility] [address], [place] (336) 299-0000   °[facility] [address] [place] [address], [place] or [address] (336) 852-7530 °(336) 878-2260   °[facility] [address], [place] (336) 350-1642, phone; (336) 294-5005, fax Sees patients 1st and 3rd Saturday of every month.  Must not qualify for public or private insurance (i.e. Medicaid, Medicare, [place] Health Choice, Veterans' Benefits) • Household income should be no more than 200% of the poverty level •The clinic cannot treat you if you are pregnant or think you are pregnant • Sexually transmitted diseases are not treated at the clinic.  ° ° °Dental Care: °Organization         Address  Phone  Notes  °[place] [facility] [address],  (336) 641-6152 Accepts children up to age 21 who are enrolled in Medicaid or [place] Health Choice; pregnant women with a Medicaid card; and children who have applied for Medicaid or [place] Health Choice, but were declined, whose parents can pay a reduced fee at time of service.  °[place]  [facility] [place]  [address], [place] (336) 641-7733 Accepts children up to age 21 who are enrolled in Medicaid or [place] Health Choice; pregnant women with a Medicaid card; and children who have applied for Medicaid or [place] Health Choice, but were declined, whose parents can pay a reduced fee at time of service.  °[facility] ° [address],  (336) 641-4533 Patients are seen by appointment only. Walk-ins are not accepted. [facility] will see patients 18 years of age and older. °Monday - Tuesday (8am-5pm) °Most Wednesdays (8:30-5pm) °$30 per visit, cash only  °[facility] ° [address], [place] (336) 641-4533 Patients are seen by appointment only. Walk-ins are not accepted. [facility] will see patients 18 years of age and older. °One   Wednesday Evening (Monthly: Volunteer Based).  $30 per visit, cash only  °[facility]  (919) 537-3737 for adults; Children under age 4, call Graduate Pediatric Dentistry at (919) [phone]. Children aged 4-14, please call (919) [phone] to request a pediatric application. ° Dental services are provided in all areas of dental care including fillings, crowns and bridges, complete and partial dentures, implants, gum treatment, root canals, and extractions. Preventive care is also provided. Treatment is provided to both adults and children. °Patients are selected via a lottery and there is often a waiting list. °  °[facility] [address], °[place] ° (336) 763-8833 [email or web address] °  °[facility] [address], [place], [place] (336)723-1848, Ext. 123 Second and Fourth Thursday of each month, opens at 6:30 AM; Clinic ends at 9 AM.  Patients are seen on a first-come first-served basis, and a limited number are seen during each clinic.  ° °[facility] ° [address], [place], [place] (336) 723-7904    Eligibility Requirements °You must have lived in [place], [place], or [place] counties for at least the last three months. °  You cannot be eligible for state or federal sponsored healthcare insurance, including Veterans Administration, Medicaid, or Medicare. °  You generally cannot be eligible for healthcare insurance through your employer.  °  How to apply: °Eligibility screenings are held every Tuesday and Wednesday afternoon from 1:00 pm until 4:00 pm. You do not need an appointment for the interview!  °[facility] [address], [place], [place] 336-631-2330   °[facility] [facility]  336-342-8273   °[facility]  336-703-3100   °[facility]  336-570-6415   ° °Behavioral Health Resources in the Community: °Intensive Outpatient Programs °Organization         Address  Phone  Notes  °[facility] [address], [place], [place] 336-878-6098   °[facility] [address], [place], [place] 336-832-9800   °[facility] [address], [place], [place] ° 336-882-2125   °[facility] [address],  °[place], [place] 1-800-853-5163 or 336-641-4981   °Substance Abuse Resources °Organization         Address  Phone  Notes  °[facility]  336-882-2125   °[facility]  336-784-9470   °[facility]  336-285-9073   °[facility]  336-845-3988   °Residential & Outpatient Substance Abuse Program  1-800-659-3381   °Psychological Services °Organization         Address  Phone  Notes  °[facility]  336- 832-9600   °[facility]  336- 378-7881   °[facility] [address], [place] 1-800-853-5163 or 336-641-4981   ° °Mobile Crisis Teams °Organization         Address  Phone  Notes  °[facility], Mobile Crisis Care Unit  1-877-626-1772   °Assertive °[facility] ° [address]  [place], [place] 336-834-9664   °Sharon DeEsch [address] °[place] 336-554-5454   ° °Self-Help/Support Groups °Organization         Address  Phone             Notes  °[facility]  - variety of support groups  336- 373-1402 Call for more information  °Narcotics Anonymous (NA), [facility] [address], °[place]  2 meetings at this location  ° °  Residential Treatment Programs °Organization         Address  Phone  Notes  °[facility] [address],    °[facility]  1-866-801-8205   °[facility] ° [address], [place], [place] 704-293-8524   °[facility] [address], [place] 336-845-3988 Admissions: 8am-3pm M-F  °[facility] [address]    °[place], [place] 336-841-1104   °[facility] [address], [place], [place] 336-379-7146   °[facility] [address]  °[place], [place] 336-285-9073   °[facility] Intensive Outpatient [address], [place], [place] 336-852-3033   °[facility] [address]  °[place], [place] 1-877-615-2722 or 336-784-9470   °[facility] [address] [place], [place] 336-227-7417 Accepts Medicaid  °[facility] [address]  °[place] 1-800-659-3381 Substance Abuse/Addiction Treatment  ° °[place] Behavioral Health Resources °Organization         Address  Phone  Notes  °[facility]  (888) 581-9988   °Julie Brannon, PhD [address] [place], [place]   (336) 349-5553 or (336) 951-0000   °[facility]   [address] °[place], [place] (336) 349-4454   °[facility] [address], [place], [place] (336) 342-8316 Insurance/Medicaid/sponsorship through [facility]  °[facility] [address]                                    [place], [place] (336) 342-8316 Therapy/tele-psych/case    °[facility] [address]  ° [place], [place] (336) 349-2233    °Dr. Arfeen  (336) 349-4544   °[facility] [place]  [employer] [place] [facility] 1) [address], [place] °2) [address], [place] °3)  [address], [place] (336) 349-3220 °(336) 342-7768 ° °(336) 342-8140   °[place] Child [facility] (336) 342-1394 or (336) 342-3537 (After Hours)    ° ° °

## 2018-07-02 ENCOUNTER — Emergency Department (HOSPITAL_COMMUNITY): Payer: Self-pay

## 2018-07-02 ENCOUNTER — Encounter (HOSPITAL_COMMUNITY): Payer: Self-pay | Admitting: Emergency Medicine

## 2018-07-02 ENCOUNTER — Emergency Department (HOSPITAL_COMMUNITY)
Admission: EM | Admit: 2018-07-02 | Discharge: 2018-07-02 | Discharge status: Against Medical Advice | Payer: Self-pay | Attending: Emergency Medicine | Admitting: Emergency Medicine

## 2018-07-02 DIAGNOSIS — Z5321 Procedure and treatment not carried out due to patient leaving prior to being seen by health care provider: Secondary | ICD-10-CM | POA: Insufficient documentation

## 2018-07-02 DIAGNOSIS — M25511 Pain in right shoulder: Secondary | ICD-10-CM | POA: Insufficient documentation

## 2018-07-02 NOTE — ED Triage Notes (Signed)
Patient reported that he felt his right shoulder joint slipped out of place this morning , denies fall or injury , pt. stated it happened in the past .

## 2018-07-02 NOTE — ED Notes (Signed)
Pt called for room with no response x3. Pt moved OTF

## 2018-07-02 NOTE — ED Notes (Signed)
No response x2  for room

## 2018-07-02 NOTE — ED Notes (Signed)
No reply at 21:55 for vitals recheck.

## 2018-07-06 NOTE — ED Notes (Signed)
Follow up call made  No answer  07/06/18  1412  s Rad Gramling rn

## 2019-11-15 ENCOUNTER — Ambulatory Visit: Payer: Self-pay | Attending: Internal Medicine

## 2019-11-15 DIAGNOSIS — Z20822 Contact with and (suspected) exposure to covid-19: Secondary | ICD-10-CM

## 2019-11-15 DIAGNOSIS — Z20828 Contact with and (suspected) exposure to other viral communicable diseases: Secondary | ICD-10-CM | POA: Insufficient documentation

## 2019-11-16 LAB — NOVEL CORONAVIRUS, NAA: SARS-CoV-2, NAA: NOT DETECTED

## 2019-11-17 ENCOUNTER — Telehealth: Payer: Self-pay | Admitting: General Practice

## 2019-11-17 NOTE — Telephone Encounter (Signed)
Gave patient negative covid test results Patient understood 

## 2021-10-31 ENCOUNTER — Ambulatory Visit (HOSPITAL_BASED_OUTPATIENT_CLINIC_OR_DEPARTMENT_OTHER): Payer: Self-pay | Admitting: Family Medicine

## 2021-11-07 ENCOUNTER — Encounter (HOSPITAL_BASED_OUTPATIENT_CLINIC_OR_DEPARTMENT_OTHER): Payer: Self-pay | Admitting: Family Medicine

## 2021-11-27 DIAGNOSIS — H5213 Myopia, bilateral: Secondary | ICD-10-CM | POA: Diagnosis not present

## 2021-11-27 DIAGNOSIS — H5203 Hypermetropia, bilateral: Secondary | ICD-10-CM | POA: Diagnosis not present

## 2021-12-17 ENCOUNTER — Ambulatory Visit (HOSPITAL_BASED_OUTPATIENT_CLINIC_OR_DEPARTMENT_OTHER): Payer: Self-pay | Admitting: Family Medicine

## 2021-12-20 ENCOUNTER — Encounter (HOSPITAL_BASED_OUTPATIENT_CLINIC_OR_DEPARTMENT_OTHER): Payer: Self-pay | Admitting: Family Medicine

## 2022-02-07 ENCOUNTER — Ambulatory Visit (HOSPITAL_BASED_OUTPATIENT_CLINIC_OR_DEPARTMENT_OTHER): Payer: Medicaid Other | Admitting: Family Medicine

## 2022-02-13 ENCOUNTER — Encounter (HOSPITAL_BASED_OUTPATIENT_CLINIC_OR_DEPARTMENT_OTHER): Payer: Self-pay | Admitting: Family Medicine

## 2022-06-23 ENCOUNTER — Encounter: Payer: Self-pay | Admitting: Urology

## 2022-06-23 ENCOUNTER — Ambulatory Visit: Payer: Medicaid Other | Admitting: Urology

## 2022-08-26 ENCOUNTER — Ambulatory Visit: Payer: Medicaid Other | Admitting: Internal Medicine

## 2022-08-26 ENCOUNTER — Encounter: Payer: Self-pay | Admitting: Internal Medicine

## 2022-08-26 VITALS — BP 124/76 | HR 74 | Temp 98.2°F | Ht 74.0 in | Wt 208.0 lb

## 2022-08-26 DIAGNOSIS — R41840 Attention and concentration deficit: Secondary | ICD-10-CM | POA: Diagnosis not present

## 2022-08-26 DIAGNOSIS — L84 Corns and callosities: Secondary | ICD-10-CM | POA: Diagnosis not present

## 2022-08-26 NOTE — Progress Notes (Signed)
Subjective:  Patient ID: Alan Conner, male    DOB: [birth year]  Age: 36 y.o. MRN: [id number]  CC: Establish Care   HPI   His wife complains of his being easily distracted and lack of focus.  He wants to be evaluated at [place] [facility].  He also complains of a several year history of callus on the bottom of his right foot.  He wants to see a podiatrist.  He was not willing to get vaccinated against influenza or tetanus today.  History Cheo has no past medical history on file.   He has no past surgical history on file.   His family history is not on file.He reports that he has been smoking. He has been exposed to tobacco smoke. He has never used smokeless tobacco. He reports current alcohol use of about 1.0 standard drink of alcohol per week. He reports current drug use. Drug: Marijuana.  Outpatient Medications Prior to Visit  Medication Sig Dispense Refill   Acetaminophen (TYLENOL PO) Take 2 tablets by mouth every 6 (six) hours as needed (pain).     HYDROcodone-acetaminophen (NORCO/VICODIN) 5-325 MG per tablet Take 1 tablet by mouth every 6 (six) hours as needed for pain. 8 tablet 0   ibuprofen (ADVIL,MOTRIN) 600 MG tablet Take 1 tablet (600 mg total) by mouth every 6 (six) hours as needed for pain. 30 tablet 0   metroNIDAZOLE (FLAGYL) 500 MG tablet Take 1 tablet (500 mg total) by mouth 2 (two) times daily. 14 tablet 0   No facility-administered medications prior to visit.    ROS Review of Systems  Constitutional: Negative.  Negative for fatigue.  HENT: Negative.    Eyes: Negative.   Respiratory:  Negative for cough and shortness of breath.   Cardiovascular:  Negative for chest pain, palpitations and leg swelling.  Gastrointestinal: Negative.  Negative for abdominal pain.  Endocrine: Negative.   Genitourinary: Negative.  Negative for penile swelling and scrotal swelling.  Musculoskeletal: Negative.   Skin: Negative.   Neurological: Negative.    Hematological: Negative.   Psychiatric/Behavioral:  Positive for decreased concentration. Negative for dysphoric mood and suicidal ideas.     Objective:  BP 124/76 (BP Location: Left Arm, Patient Position: Sitting, Cuff Size: Large)   Pulse 74   Temp 98.2 F (36.8 C) (Oral)   Ht '6\' 2"'$  (1.88 m)   Wt 208 lb (94.3 kg)   SpO2 95%   BMI 26.71 kg/m   Physical Exam Vitals reviewed.  HENT:     Nose: Nose normal.     Mouth/Throat:     Mouth: Mucous membranes are moist.  Eyes:     General: No scleral icterus.    Conjunctiva/sclera: Conjunctivae normal.  Cardiovascular:     Rate and Rhythm: Normal rate and regular rhythm.     Heart sounds: No murmur heard. Pulmonary:     Effort: Pulmonary effort is normal.     Breath sounds: No stridor. No wheezing, rhonchi or rales.  Abdominal:     General: Abdomen is flat.     Palpations: There is no mass.     Tenderness: There is no abdominal tenderness. There is no guarding.     Hernia: No hernia is present.  Musculoskeletal:        General: Normal range of motion.     Cervical back: Neck supple.     Right lower leg: No edema.  Lymphadenopathy:     Cervical: No cervical adenopathy.  Skin:    General:  Skin is warm.  Neurological:     General: No focal deficit present.     Mental Status: He is alert.     Lab Results  Component Value Date   WBC 9.8 04/30/2013   HGB 15.6 04/30/2013   HCT 44.5 04/30/2013   PLT 231 04/30/2013   GLUCOSE 116 (H) 04/30/2013   ALT 23 04/30/2013   AST 31 04/30/2013   NA 139 04/30/2013   K 3.5 04/30/2013   CL 107 04/30/2013   CREATININE 1.13 04/30/2013   BUN 15 04/30/2013   CO2 25 04/30/2013   TSH 0.690 04/30/2013     Assessment & Plan:   Shaheed was seen today for establish care.  Diagnoses and all orders for this visit:  Poor concentration -     Ambulatory referral to Psychiatry  Callus of foot -     Ambulatory referral to Podiatry   I have discontinued Robyn Haber. [place]'s  Acetaminophen (TYLENOL PO), ibuprofen, metroNIDAZOLE, and HYDROcodone-acetaminophen.  No orders of the defined types were placed in this encounter.    Follow-up: No follow-ups on file.  Scarlette Calico, MD

## 2022-08-27 ENCOUNTER — Ambulatory Visit: Payer: Medicaid Other | Admitting: Podiatry

## 2022-08-27 DIAGNOSIS — R41840 Attention and concentration deficit: Secondary | ICD-10-CM | POA: Insufficient documentation

## 2022-08-30 ENCOUNTER — Encounter: Payer: Self-pay | Admitting: Internal Medicine

## 2022-08-30 DIAGNOSIS — L84 Corns and callosities: Secondary | ICD-10-CM | POA: Insufficient documentation

## 2022-09-08 ENCOUNTER — Ambulatory Visit (INDEPENDENT_AMBULATORY_CARE_PROVIDER_SITE_OTHER): Payer: Medicaid Other | Admitting: Podiatry

## 2022-09-08 DIAGNOSIS — D492 Neoplasm of unspecified behavior of bone, soft tissue, and skin: Secondary | ICD-10-CM

## 2022-09-08 DIAGNOSIS — B070 Plantar wart: Secondary | ICD-10-CM

## 2022-09-08 NOTE — Progress Notes (Signed)
   Chief Complaint  Patient presents with   Callouses    Patient is here for right foot on the bottom that is very painful.    Subjective: 36 y.o. male presenting today as a new patient for evaluation of a symptomatic skin lesion noted to the plantar aspect of the right foot that has been ongoing for about a year now.  Patient works on his feet all day.  He has tried some OTC corn callus remover with minimal relief.   No past medical history on file.  Objective: Physical Exam General: The patient is alert and oriented x3 in no acute distress.   Dermatology: Hyperkeratotic skin lesion(s) noted to the plantar aspect of the right foot approximately 0.5 cm in diameter. Pinpoint bleeding noted upon debridement. Skin is warm, dry and supple bilateral lower extremities. Negative for open lesions or macerations.   Vascular: Palpable pedal pulses bilaterally. No edema or erythema noted. Capillary refill within normal limits.   Neurological: Epicritic and protective threshold grossly intact bilaterally.    Musculoskeletal Exam: Tenderness on palpation to the noted skin lesion(s).  Range of motion within normal limits to all pedal and ankle joints bilateral. Muscle strength 5/5 in all groups bilateral.    Assessment: #1 plantar wart right foot #2 pain in right foot     Plan of Care:  #1 Patient was evaluated. #2 Excisional debridement of the plantar wart lesion(s) was performed using a chisel blade.  Salicylic acid was applied and the lesion(s) was dressed with a dry sterile dressing. #3  Recommend salicylic acid daily #4 return to clinic as needed  *Landscaping     Edrick Kins, DPM [facility]  Dr. Edrick Kins, DPM    [address], [place] [address]                Office [phone]  Fax [phone]

## 2022-09-09 ENCOUNTER — Ambulatory Visit: Payer: Medicaid Other

## 2022-09-11 ENCOUNTER — Other Ambulatory Visit (HOSPITAL_BASED_OUTPATIENT_CLINIC_OR_DEPARTMENT_OTHER): Payer: Self-pay

## 2022-09-11 MED ORDER — INFLUENZA VAC SPLIT QUAD 0.5 ML IM SUSY
PREFILLED_SYRINGE | INTRAMUSCULAR | 0 refills | Status: AC
  Filled 2022-09-11: qty 0.5, 1d supply, fill #0

## 2022-09-12 ENCOUNTER — Other Ambulatory Visit (HOSPITAL_BASED_OUTPATIENT_CLINIC_OR_DEPARTMENT_OTHER): Payer: Self-pay

## 2023-12-08 DIAGNOSIS — H5203 Hypermetropia, bilateral: Secondary | ICD-10-CM | POA: Diagnosis not present

## 2023-12-09 DIAGNOSIS — H5213 Myopia, bilateral: Secondary | ICD-10-CM | POA: Diagnosis not present

## 2024-01-01 ENCOUNTER — Telehealth: Payer: Self-pay | Admitting: *Deleted

## 2024-01-01 NOTE — Telephone Encounter (Signed)
Patient's wife, Myrene Buddy who states she is your patient called to see if you would accept this patient as a new patient. Please advise. Thanks.

## 2024-01-04 NOTE — Telephone Encounter (Signed)
Please see previous note. New patients on hold at this time due to high patient volumes and difficulty getting est patients in for routine visits. May reopen in a few months (mid to late summer).

## 2024-02-08 DIAGNOSIS — Z3009 Encounter for other general counseling and advice on contraception: Secondary | ICD-10-CM | POA: Diagnosis not present
